# Patient Record
Sex: Male | Born: 1955 | Race: White | Hispanic: No | Marital: Married
Health system: Southern US, Community
[De-identification: ages and names within clinical notes are randomized; demographics above are authoritative.]

---

## 1998-07-26 ENCOUNTER — Ambulatory Visit (HOSPITAL_COMMUNITY): Admission: RE | Admit: 1998-07-26 | Discharge: 1998-07-26 | Payer: Self-pay | Admitting: General Surgery

## 2002-02-02 ENCOUNTER — Inpatient Hospital Stay (HOSPITAL_COMMUNITY): Admission: EM | Admit: 2002-02-02 | Discharge: 2002-02-07 | Payer: Self-pay | Admitting: *Deleted

## 2002-02-20 ENCOUNTER — Ambulatory Visit: Admission: RE | Admit: 2002-02-20 | Discharge: 2002-02-20 | Payer: Self-pay | Admitting: Internal Medicine

## 2002-03-15 ENCOUNTER — Ambulatory Visit (HOSPITAL_COMMUNITY): Admission: RE | Admit: 2002-03-15 | Discharge: 2002-03-15 | Payer: Self-pay | Admitting: Cardiology

## 2005-12-01 ENCOUNTER — Emergency Department (HOSPITAL_COMMUNITY): Admission: EM | Admit: 2005-12-01 | Discharge: 2005-12-01 | Payer: Self-pay | Admitting: Emergency Medicine

## 2007-03-16 ENCOUNTER — Encounter: Admission: RE | Admit: 2007-03-16 | Discharge: 2007-03-16 | Payer: Self-pay | Admitting: Internal Medicine

## 2007-11-08 ENCOUNTER — Encounter: Admission: RE | Admit: 2007-11-08 | Discharge: 2007-11-08 | Payer: Self-pay | Admitting: Gastroenterology

## 2008-08-22 ENCOUNTER — Emergency Department (HOSPITAL_COMMUNITY): Admission: EM | Admit: 2008-08-22 | Discharge: 2008-08-22 | Payer: Self-pay | Admitting: Emergency Medicine

## 2010-05-19 IMAGING — CT CT ABDOMEN W/O CM
2 of 4 series · 14 of 32 positions shown, 19 images · non-contrast
Comparison: 11/08/2007

CT ABDOMEN

CLINICAL DATA: Abdominal pain that radiates to right flank.
Dysuria.  Microscopic hematuria.

CT OF THE ABDOMEN AND PELVIS WITHOUT CONTRAST (CT UROGRAM)
TECHNIQUE: Multidetector CT imaging was performed through the
abdomen and pelvis to include the urinary tract.

[Series 2: renal stone · axial · 0.70mm/px · z∈[-442,-126]mm · 6 of 89 slices shown, 11 images]
[im 13/89  soft-tissue]
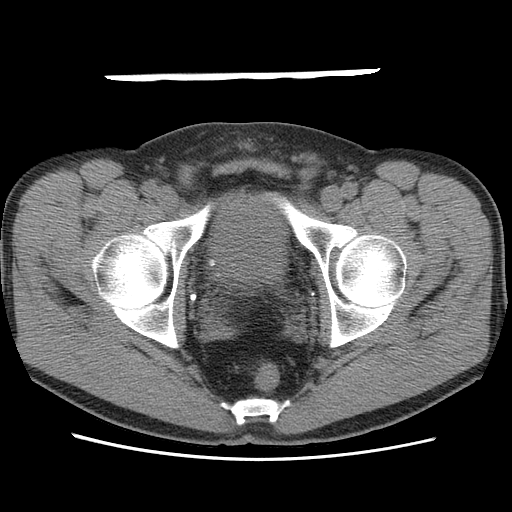
[im 13/89  bone]
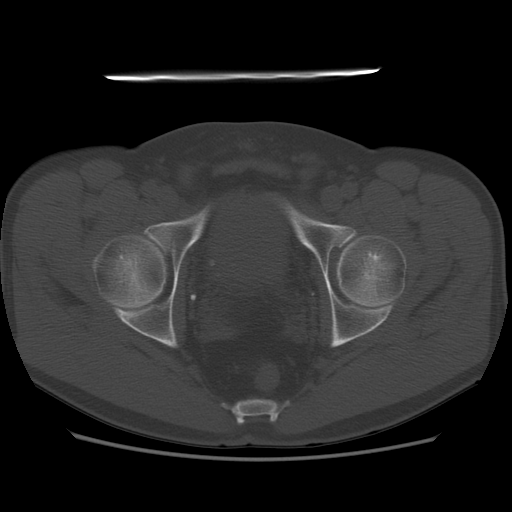
[im 26/89  soft-tissue]
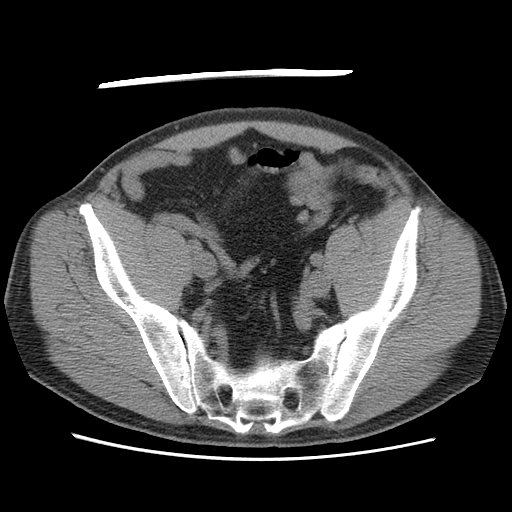
[im 38/89  soft-tissue]
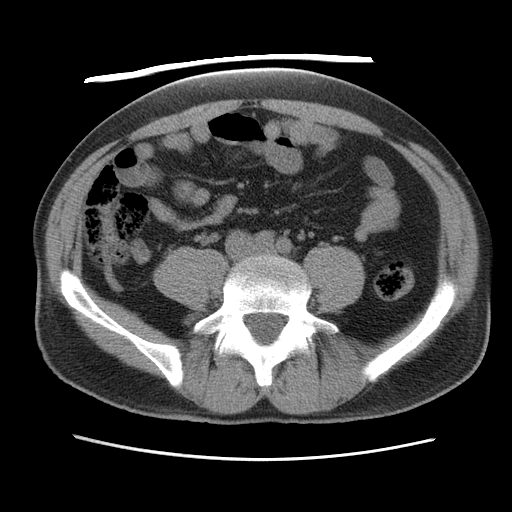
[im 38/89  lung]
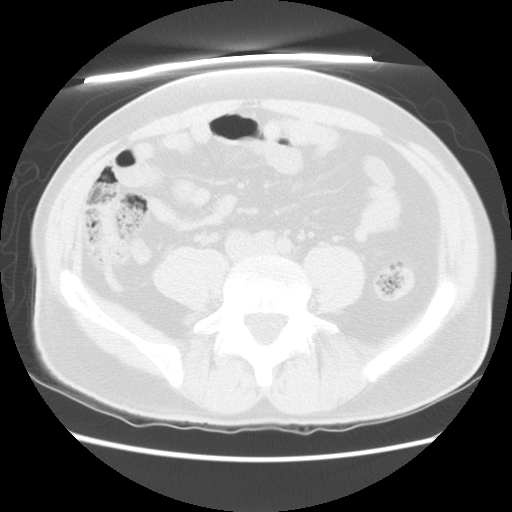
[im 51/89  soft-tissue]
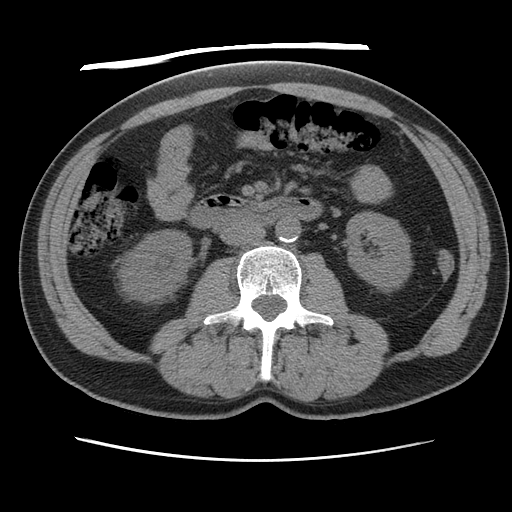
[im 51/89  lung]
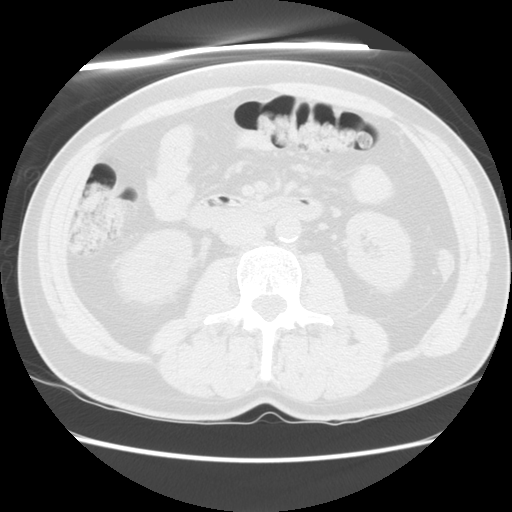
[im 63/89  soft-tissue]
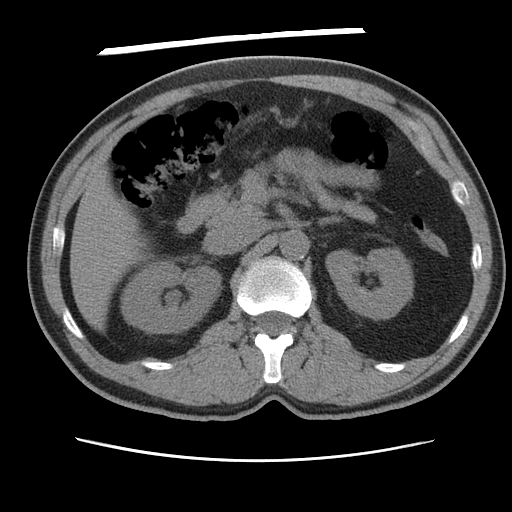
[im 63/89  lung]
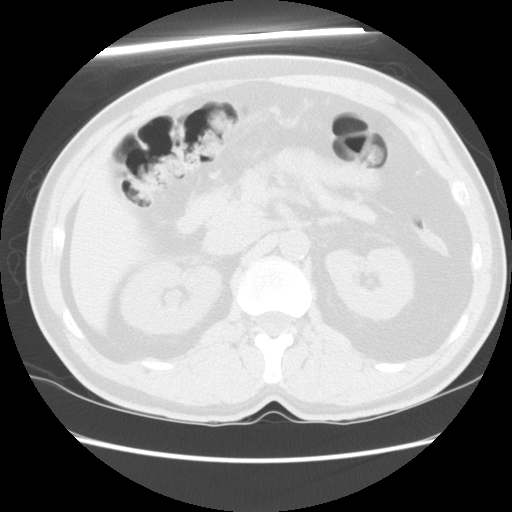
[im 76/89  soft-tissue]
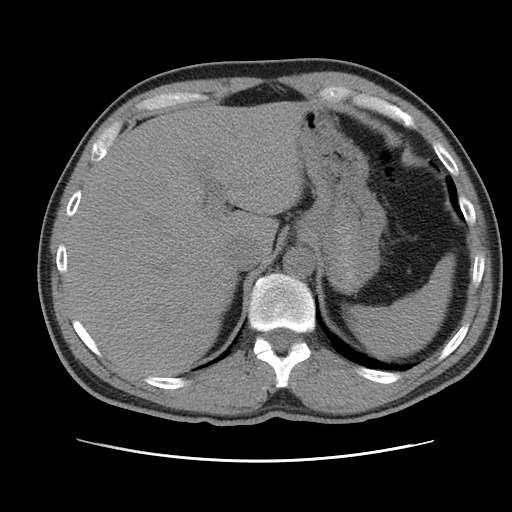
[im 76/89  lung]
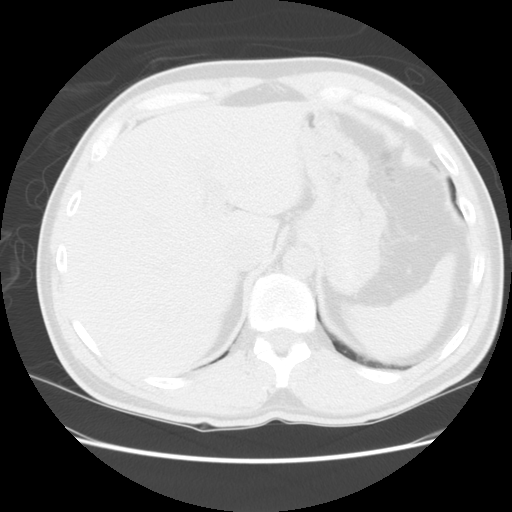

[Series 401: sagittals · sagittal · 0.89mm/px · 8 of 160 slices shown]
[im 15/160  soft-tissue]
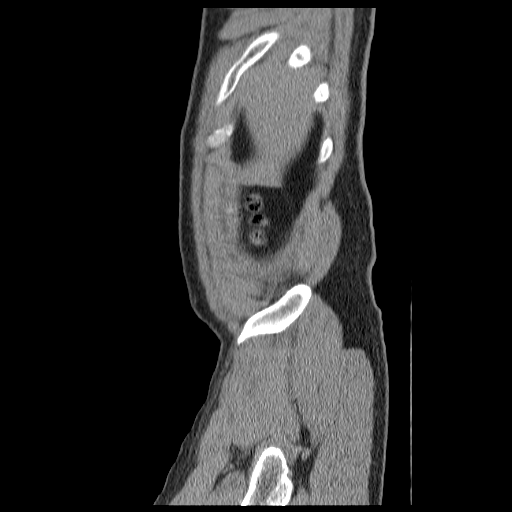
[im 29/160  soft-tissue]
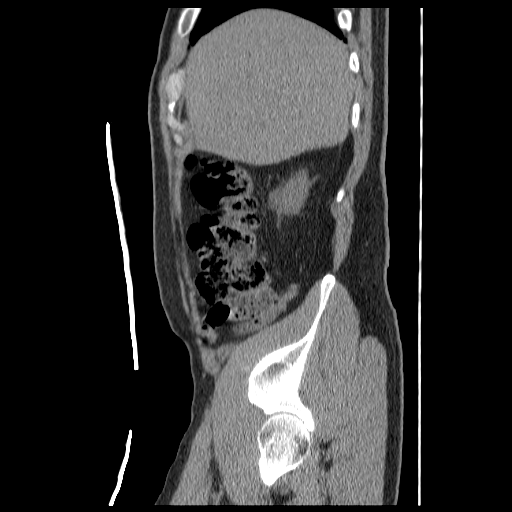
[im 58/160  soft-tissue]
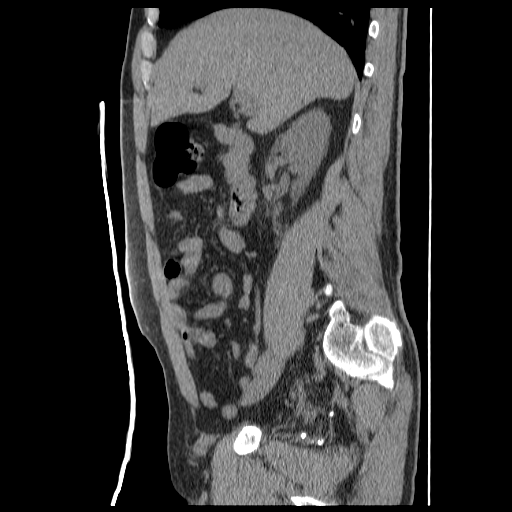
[im 73/160  soft-tissue]
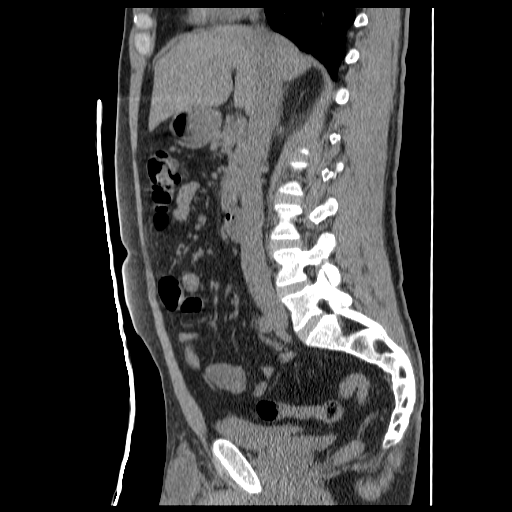
[im 87/160  soft-tissue]
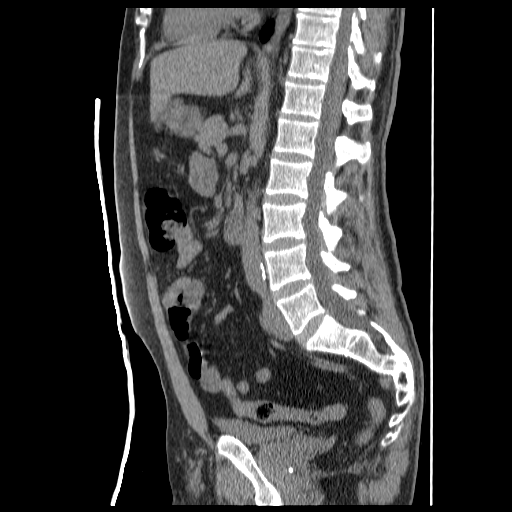
[im 102/160  soft-tissue]
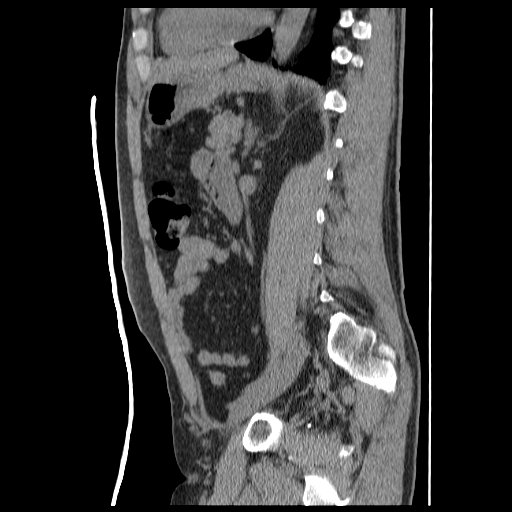
[im 131/160  soft-tissue]
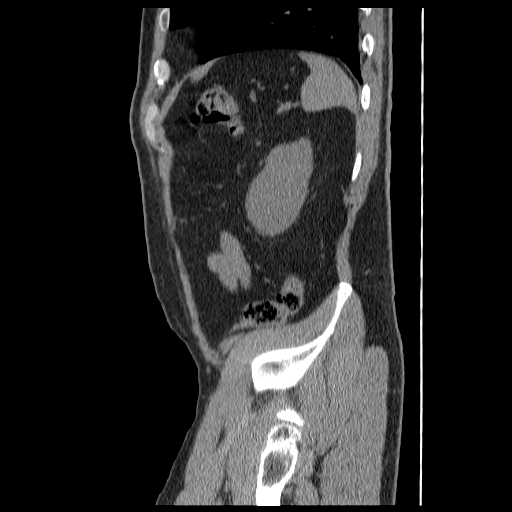
[im 145/160  soft-tissue]
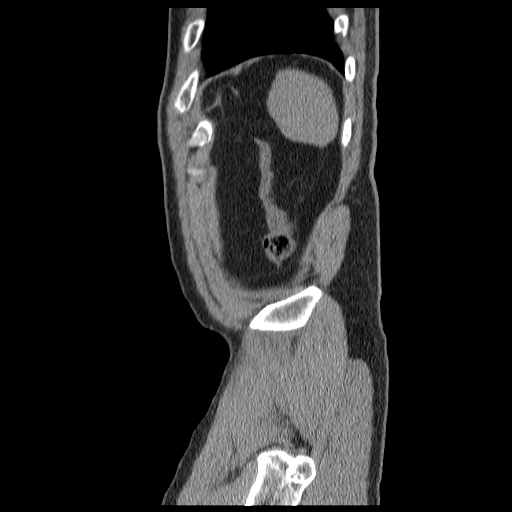

[14 of 32 positions shown; findings below may reference images not displayed]

FINDINGS: Findings compatible acute urinary tract obstruction on
the right.  Right hydroureteronephrosis, mild degree.  3 mm
calculus in the right lower pole collecting system.
IMPRESSION: Acute right hydroureteronephrosis..  CT pelvis follow-up.  Small
calculus right lower pole.

CT PELVIS
FINDINGS: 3 mm calculus at the right UVJ.  Bladder unremarkable.
Possible scarring at the left inguinal region.  Query history of
prior left herniorrhaphy.
IMPRESSION: 3 mm obstructing calculus at the right UVJ.

## 2011-03-20 NOTE — Discharge Summary (Signed)
[facility]  Patient:    BELAL, SCALLON Visit Number: [id number] MRN: [id number]          Service Type: MED Location: 3W [id number] Attending Physician:  Melvyn Novas Dictated by:   Melvyn Novas, M.D. Admit Date:  02/02/2002 Discharge Date: 02/07/2002   CC:         Kern Reap, M.D., [facility]  [facility]   Discharge Summary  REASON FOR CONSULTATION:  The patient was admitted on February 03, 2002 after an emergency room consultation for possible stroke.  The consult was called by the ER physician Dr. Josie Saunders.  He had presented with diplopia, difficulty with gait, nausea, vomiting, as well as left-sided numbness and right-sided weakness of the body.  A significant amount of Ativan and Phenergan was given prior to my consultation which someone blunted the patients affect and some of the neurologic findings as well.  The patient underwent a stroke workup and was admitted to neurology.  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  PROCEDURES AND CONSULTATIONS:  An MRI/MRA of the brain was obtained during the emergency room visit which showed a vertebral dissection of the artery on the right.  The patient also had an echocardiogram that showed normal sinus rhythm and QRS complexes.  The patient also had an echocardiogram which showed normal ejection fraction and no abnormal dilation, wall motion or valve problems.  A carotid Doppler study was obtained and showed normal flow.  Blood chemistry for coagulation, a basic metabolic Chem-7 panel, a urinalysis, liver function tests, albumin were all in normal range.  MEDICATIONS AT DISCHARGE:  The patient received IV heparin immediately after the diagnosis of vertebral artery dissection was made and was also given Coumadin on the first day of stay.  He had also p.r.n. medication, Tylenol 650 mg for headache and p.r.n. Reglan for  gastric reflux.  ALLERGIES:  No known drug allergies.  HOSPITAL COURSE:  The patient was admitted after the diagnosis of right vertebral artery dissection was made through an MRI/MRA study at [facility].  He recovered from all symptoms and the MRI did not verify any ischemic insult, stroke or embolic event.  He began Coumadin at 10 mg doses for the first 2 days of his hospital stay and then 7.5 mg on Sunday.  The INR on Sunday morning was 1.3, on Monday 1.9, and he was discharged on a q.d. Coumadin dose of 5 mg p.o.  The patient had normal vital signs and remained stable throughout the hospital stay but showed borderline hypertension in the 150 range with systolic blood pressures at least two times daily during his vital and neuro checks which were performed every 4 hours.  We established a primary care relationship for this patient with Dr. Barbee Shropshire of [facility] who will follow the patients INR levels and adjust the Coumadin accordingly.  I asked the patient to return to [facility] for a followup visit and at 6 months from now we can make a decision if Coumadin could be discontinued and perhaps an antiplatelet medication be started instead.  He is aware that another MRA or at least Doppler studies will be performed prior to allow [place] to make this decision.  The patient was not in favor of being placed on an antihypertensive medication and will be advised by Dr. Barbee Shropshire with whom he follows up 3 days after discharge.Dictated by:   Melvyn Novas, M.D. Attending Physician:  Melvyn Novas DD:  02/17/02 TD:  02/20/02 Job: [id number]

## 2011-03-20 NOTE — Op Note (Signed)
. [facility]  Patient:    VIRGLE, ARTH Visit Number: [id number] MRN: [id number]          Service Type: END Location: ENDO Attending Physician:  Pamella Pert Dictated by:   Delrae Rend, M.D. Proc. Date: 03/15/02 Admit Date:  03/15/2002   CC:         Georgianne Fick, M.D.   Operative Report  PROCEDURE:  Transesophageal echocardiogram.  INDICATION:  Mr. Skibicki is a 55 year old gentleman with no significant past medical history, who was admitted to the hospital with a vertebrobasilar insufficiency.  Given no cardiac risk factors and his new onset of vertebrobasilar insufficiency, a transesophageal echocardiogram is being performed to evaluate for evidence of atrial septal defect and/or patent foramen ovale.  TECHNIQUE:  Under mild sedation and local anesthetic spray, a [employer] omniplane probe was easily introduced into the esophagus and transesophageal echocardiogram was performed.  DATA:  Left atrium:  The left atrium is normal.  Left ventricle:  The left ventricle is normal.  There is normal segmental wall motion.  Right ventricle:  The right ventricle is normal.  Right atrium:  The right atrium is normal.  Mitral valve:  The mitral valve is normal.  There is minimal mitral valve regurgitation.  Tricuspid valve:  The tricuspid valve is normal.  Aortic valve:  The aortic valve is normal.  There is trivial aortic valvular regurgitation.  Pulmonary valve:  The pulmonary valve is normal.  There is trivial pulmonary valvular regurgitation.  Pericardium:  The pericardium is normal.  Left atrial appendage:  Left atrial appendage is well-visualized.  There is no evidence of left atrial appendage clot.  Interatrial septum:  The interatrial septum is intact.  There is no color Doppler evidence of PFO.  The double contrast study did not also reveal any evidence of patent foramen ovale or evidence of  right-to-left shunt.  Interventricular septum:  The interventricular septum is normal with no evidence of ventricular septal defects.  Aorta:  The ascending aorta, arch of the aorta, and the thoracic aorta all appear to be normal with no evidence of complex plaque.  FINAL INTERPRETATION: 1. Structurally normal heart. 2. No evidence of patent foramen ovale or ventricular septal defect, both by    color Doppler and by double contrast study. 3. No complex aortic plaque.Dictated by:   Delrae Rend, M.D. Attending Physician:  Pamella Pert DD:  03/15/02 TD:  03/16/02 Job: [id number]

## 2011-03-20 NOTE — Consult Note (Signed)
[facility]  Patient:    Thomas Hughes, Thomas Hughes Visit Number: [id number] MRN: [id number]          Service Type: MED Location: 3W [phone] Attending Physician:  Melvyn Novas Admit Date:  02/02/2002 Disc. Date: 02/07/02                            Consultation Report  HISTORY OF PRESENT ILLNESS:  [name] was admitted on February 03, 2002, through the ER after a consult by Dr. ______.  He presented with diplopia, difficulty with gait dystaxia, nausea, vomiting, as well as left-sided numbness and right-sided weakness of the body.  The patient had received a significant amount of Ativan and Phenergan, before I was able to evaluate him in the office, but was compliant and cooperative.  His family was at the bedside assisting with obtaining a complete history.  MRI/MRA study was performed that night at [facility] which showed right-sided vertebral dissection of the artery, and other tests which had been performed earlier to rule out increased risk of stroke turned out negative.  The patient had no findings by MRI of an ischemic insult in spinal cord brain stem or brain.  The echocardiogram showed a normal ejection fraction in the 60% range without dilation, akinetic parts, or valvular problems.  The obtained carotid Doppler showed bilaterally antegrade normal flow without evidence of stenosis. The prevertebral arteries also have been seen producing a normal antegrade flow by the Doppler study.  [name] diagnosis of dissection of the right vertebral artery made anticoagulation immediately necessary, and he was admitted for IV heparin by protocol without bolus.  The patient also received his first dose of Coumadin that day, and over the following weekend, received doses of 2x 10 mg and 7.5 mg on Sunday.  His INR on Sunday was 1.3, on Monday was 1.9.  He has recovered completely without focal neurologic deficits.  I find him  without significant impairment, and therefore discharged [name] to be followed by Dr. Barbee Shropshire at [facility] for his Coumadin management.  He will see me six months from now in a follow-up visit to have either a repeat MRA or a Doppler study, helping [place] to evaluate the degree of stenosis or obstruction of the right vertebral artery, and if this has deemed to clinical and significant degree, we will be able to change his Coumadin medication to an oral antiplatelet medication.  His vital signs remained stable throughout this time, but he showed borderline hypertension which is another problem I will address for his followup with Dr. Barbee Shropshire.  I discharged [name] on Coumadin 5 mg q.d., and encouraged him to take p.r.n. medication as needed for headaches, Tylenol 650 mg p.o. for gastric reflux, Reglan, and I also explained that a laxative of choice or an antacid of choice would not interfere with his Coumadin regimen.  [name] at this time was not in favor of being placed on a beta blocker or ACE inhibitor, and I believe his treatment of hypertension should be turned over to Dr. Barbee Shropshire. Attending Physician:  Melvyn Novas DD:  02/06/02 TD:  02/07/02 Job: [phone] [id number]

## 2011-03-20 NOTE — Consult Note (Signed)
NAME:  Thomas Hughes, Thomas Hughes NO.:  [id number]   MEDICAL RECORD NO.:  [id number]          PATIENT TYPE:  EMS   LOCATION:  MAJO                         FACILITY:  [facility]   PHYSICIAN:  Thomas P. Pearlean Brownie, MD    DATE OF BIRTH:  December 18, 1955   DATE OF CONSULTATION:  DATE OF DISCHARGE:                                   CONSULTATION   REFERRING PHYSICIAN:  Dr. Carleene Hughes.   REASON FOR REFERRAL:  Code stroke.   HISTORY OF PRESENT ILLNESS:  Mr. Thomas Hughes is a 55 year old Caucasian male who  developed sudden onset of multifocal symptoms of a flushed sensation on his  forehead, decreased concentration, right-sided neck pain as well as right  cheek and lip numbness which lasted for about 30-40 minutes.  He called his  wife, who drove him to the emergency room.  His symptoms resolved after  arrival.  A code stroke was called; I saw the patient immediately.  The  patient's symptoms had almost completely resolve and hence he was not a  candidate for any aggressive intervention.  CT scan of the head was  unremarkable.  Subsequently, an MRI scan of the brain with MRA was ordered  by me and interpreted by me, and it was also unremarkable.   The patient has a prior history of suspected right vertebral artery  dissection in April 2003 when he was admitted with symptoms of double  vision, right-sided neck pain, right-sided incoordination and weakness.  At  that time his MRI scan did not show any acute infarct; however, an MRA of  the neck showed diminished caliber of the right vertebral artery throughout,  raising the question of possible small proximal dissection.  The patient was  started on heparin and subsequently placed on Coumadin.  He was advised to  follow up in my practice, but chose to go to [facility], where he saw Dr. Aida Hughes.  An outpatient cerebral angiogram was done apparently at [facility] and the  diagnosis of right vertebral artery dissection was again not certain, but he  was asked to take  Coumadin, which he took for a year and discontinued.  His  only other vascular risk factor identified included borderline hypertension  for which he chose not to be on therapy.  The patient denies any history of  smoking, diabetes, heart disease, hyperlipidemia or family history of  strokes.   PAST MEDICAL HISTORY:  Unremarkable.   HOME MEDICATIONS:  None.   FAMILY HISTORY:  He works at [employer], does not smoke or drink and is  independent in activities of daily living.   REVIEW OF SYSTEMS:  Negative.   PHYSICAL EXAMINATION:  GENERAL:  Physical exam reveals a pleasant young  Caucasian male who is not in distress and afebrile.  Pulse rate is 70 per  minute and regular, blood pressure 191/110 on admission, respiratory rate is  16 per minute, pulse 96 per minute, regular.  Saturation is 96% on room air.  HEAD:  Nontraumatic.  NECK:  Supple without bruit.  ENT:  Exam unremarkable.  CARDIAC:  No murmur or gallop.  LUNGS:  Clear to auscultation.  ABDOMEN:  Soft and nontender.  NEUROLOGICAL:  The patient is awake, alert and oriented x3 with normal  speech and language function.  There is no aphasia, apraxia or dysarthria.  Pupils are about equally reactive.  There is no Horner's syndrome seen.  Visual acuity appears adequate.  Face is symmetric.  Bilateral movements are  normal.  Tongue is midline.  Motor system exam reveals symmetric upper and  lower extremity strength, tone, reflexes, coordination and sensation.  He  walks with a slow steady gait including tandem walking.   DATA REVIEW:  MRI scan of the brain done today reveals no acute or old  infarct.   MRA of the neck including fat suppression images do not show any definite  signs of right vertebral artery dissection.  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  MRA of the intracranial circulation shows symmetric flow in all  anterior as well as  posterior circulation vessels without any significant  high-grade stenosis or aneurysm seen.   Previous discharge summary from April 2003 from Dr. Vickey Hughes was reviewed.   IMPRESSION:  [name] with nonspecific symptoms of  right face paresthesias and dizziness and neck pain of unclear etiology.  He  has a previous history of possible right vertebral artery dissection for  which he has been on anticoagulation in the past.  At the present time I do  not believe his history as well as neuro-imaging findings support a  dissection.  I would highly recommend that he take enteric-coated aspirin  325 mg a day for stroke prevention, as well as start antihypertensives.  I  have advised him to follow up with his family physician, Dr. Andi Hughes, for the same.  He will follow up with his neurologist, Dr. Aida Hughes,  at [facility] for possible right vertebral artery dissection in the future as  necessary.   Thank you for the referral.           ______________________________  Thomas Hughes. Pearlean Brownie, MD     PPS/MEDQ  D:  12/01/2005  T:  12/02/2005  Job:  [id number]

## 2011-08-03 LAB — URINE MICROSCOPIC-ADD ON

## 2011-08-03 LAB — POCT I-STAT, CHEM 8
BUN: 22
Calcium, Ion: 1.31
Chloride: 104
Creatinine, Ser: 1
Glucose, Bld: 125 — ABNORMAL HIGH
HCT: 47
Hemoglobin: 16
Potassium: 4.7
Sodium: 142
TCO2: 30

## 2011-08-03 LAB — DIFFERENTIAL
Basophils Absolute: 0
Eosinophils Absolute: 0.1
Eosinophils Relative: 1
Lymphs Abs: 1
Monocytes Absolute: 0.6

## 2011-08-03 LAB — URINALYSIS, ROUTINE W REFLEX MICROSCOPIC
Bilirubin Urine: NEGATIVE
Leukocytes, UA: NEGATIVE
Nitrite: NEGATIVE
Specific Gravity, Urine: 1.032 — ABNORMAL HIGH
Urobilinogen, UA: 0.2
pH: 5

## 2011-08-03 LAB — CBC
HCT: 44.6
Hemoglobin: 15.3
RBC: 4.74
RDW: 13.7
WBC: 10.4

## 2013-08-30 ENCOUNTER — Other Ambulatory Visit: Payer: Self-pay | Admitting: Internal Medicine

## 2020-12-02 ENCOUNTER — Ambulatory Visit: Payer: Self-pay | Attending: Family

## 2020-12-02 DIAGNOSIS — Z23 Encounter for immunization: Secondary | ICD-10-CM

## 2021-03-21 NOTE — Progress Notes (Signed)
   Covid-19 Vaccination Clinic  Name:  Thomas Hughes    MRN: [id number] DOB: 09/02/1956  03/21/2021  [name] was observed post Covid-19 immunization for 15 minutes without incident. He was provided with Vaccine Information Sheet and instruction to access the V-Safe system.   [name] was instructed to call 911 with any severe reactions post vaccine: Marland Kitchen Difficulty breathing  . Swelling of face and throat  . A fast heartbeat  . A bad rash all over body  . Dizziness and weakness   Immunizations Administered    Name Date Dose VIS Date Route   Moderna Covid-19 Booster Vaccine 12/02/2020  1:45 PM 0.25 mL 08/21/2020 Intramuscular   Manufacturer: Moderna   Lot: [id number]   NDC: 66063-016-01

## 2022-11-19 ENCOUNTER — Emergency Department (HOSPITAL_COMMUNITY)
Admission: EM | Admit: 2022-11-19 | Discharge: 2022-11-19 | Disposition: A | Discharge status: Home / Self Care | Payer: BC Managed Care – PPO | Attending: Emergency Medicine | Admitting: Emergency Medicine

## 2022-11-19 ENCOUNTER — Other Ambulatory Visit: Payer: Self-pay

## 2022-11-19 ENCOUNTER — Emergency Department (HOSPITAL_COMMUNITY): Payer: BC Managed Care – PPO

## 2022-11-19 DIAGNOSIS — W000XXA Fall on same level due to ice and snow, initial encounter: Secondary | ICD-10-CM | POA: Insufficient documentation

## 2022-11-19 DIAGNOSIS — S0990XA Unspecified injury of head, initial encounter: Secondary | ICD-10-CM | POA: Insufficient documentation

## 2022-11-19 DIAGNOSIS — W19XXXA Unspecified fall, initial encounter: Secondary | ICD-10-CM

## 2022-11-19 DIAGNOSIS — M546 Pain in thoracic spine: Secondary | ICD-10-CM | POA: Insufficient documentation

## 2022-11-19 DIAGNOSIS — R0781 Pleurodynia: Secondary | ICD-10-CM | POA: Diagnosis not present

## 2022-11-19 DIAGNOSIS — M549 Dorsalgia, unspecified: Secondary | ICD-10-CM

## 2022-11-19 DIAGNOSIS — R7309 Other abnormal glucose: Secondary | ICD-10-CM | POA: Diagnosis not present

## 2022-11-19 LAB — CBG MONITORING, ED: Glucose-Capillary: 138 mg/dL — ABNORMAL HIGH (ref 70–99)

## 2022-11-19 NOTE — Discharge Instructions (Addendum)
You were seen in the emergency department after a fall.  As we discussed, your imaging did not show any broken or dislocated bones. We diagnose concussions based on the symptoms and I would not be surprised if you have one. I recommend following up with your primary doctor if your symptoms persist.   Please use acetaminophen (Tylenol) or ibuprofen (Advil, Motrin) for pain.  You may use 800 mg ibuprofen every 6 hours or 1000 mg of acetaminophen every 6 hours.  You may choose to alternate between the two, this would be most effective. Do not exceed 4000 mg of acetaminophen within 24 hours.  Do not exceed 3200 mg ibuprofen within 24 hours.  Continue to monitor how you're doing and return to the ER for new or worsening symptoms.

## 2022-11-19 NOTE — ED Triage Notes (Signed)
Pt states that on Monday [place] and slipped on a patch of ice, landing on his back and hitting the back of his head. Pt denies LOC, no blood thinners per pt. Pt was ambulatory since then. C/o back pain "between my shoulder blades" and L sided rib cage pain.

## 2022-11-19 NOTE — ED Provider Triage Note (Signed)
Emergency Medicine Provider Triage Evaluation Note  Thomas Hughes , a 67 y.o. male  was evaluated in triage.  Pt complains of fall x 3 days ago. Was hiking and slipped on some ice, falling onto his back. Struck his head, upper back, and left side of his ribs and continuing to have pain, especially with coughing.  Review of Systems  Positive: Upper back pain, left sided rib pain, mild headache, nausea Negative: Weakness, numbness, neck pain, low back pain  Physical Exam  BP (!) 167/106   Pulse 79   Temp 98.1 F (36.7 C) (Oral)   Resp 18   SpO2 100%  Gen:   Awake, no distress   Resp:  Normal effort  MSK:   Moves extremities without difficulty  Other:    Medical Decision Making  Medically screening exam initiated at 1:31 PM.  Appropriate orders placed.  Thomas Hughes was informed that the remainder of the evaluation will be completed by another provider, this initial triage assessment does not replace that evaluation, and the importance of remaining in the ED until their evaluation is complete.     Catlyn Shipton T, PA-C 11/19/22 1331

## 2022-11-19 NOTE — ED Provider Notes (Signed)
[facility] DEPT Provider Note   CSN: [id number] Arrival date & time: 11/19/22  1306     History  Chief Complaint  Patient presents with   Fall   Back Pain   Rib Injury    Thomas Hughes is a 67 y.o. male with no significant past medical history presents the emergency department after a fall.  Patient was hiking 3 days ago, and slipped on some ice.  He fell back hitting his head, his upper back, and the left side of his ribs.  He has been having pain since then, had some improvement with over-the-counter medications but since his symptoms persisted he wanted to be evaluated.  Denied any loss of consciousness at the time of the fall.  Has been feeling slightly nauseous, no vomiting.  He is concerned he could have concussion.  Not on chronic anticoagulation.   Fall Associated symptoms include headaches.  Back Pain Associated symptoms: headaches   Associated symptoms: no numbness and no weakness        Home Medications Prior to Admission medications   Not on File      Allergies    Patient has no known allergies.    Review of Systems   Review of Systems  Gastrointestinal:  Positive for nausea. Negative for vomiting.  Musculoskeletal:  Positive for back pain. Negative for neck pain.  Neurological:  Positive for headaches. Negative for weakness and numbness.  All other systems reviewed and are negative.   Physical Exam Updated Vital Signs BP (!) 156/86 (BP Location: Right Arm)   Pulse 63   Temp 97.7 F (36.5 C) (Oral)   Resp 16   SpO2 100%  Physical Exam Vitals and nursing note reviewed.  Constitutional:      Appearance: Normal appearance.  HENT:     Head: Normocephalic and atraumatic.  Eyes:     Conjunctiva/sclera: Conjunctivae normal.  Cardiovascular:     Rate and Rhythm: Normal rate and regular rhythm.  Pulmonary:     Effort: Pulmonary effort is normal. No respiratory distress.     Breath sounds: Normal breath sounds.   Abdominal:     General: There is no distension.     Palpations: Abdomen is soft.     Tenderness: There is no abdominal tenderness.  Musculoskeletal:     Comments: No midline spinal tenderness, step offs or crepitus. 5/5 strength in all extremities. Normal sensation.   Skin:    General: Skin is warm and dry.  Neurological:     General: No focal deficit present.     Mental Status: He is alert.     ED Results / Procedures / Treatments   Labs (all labs ordered are listed, but only abnormal results are displayed) Labs Reviewed  CBG MONITORING, ED - Abnormal; Notable for the following components:      Result Value   Glucose-Capillary 138 (*)    All other components within normal limits    EKG None  Radiology DG Thoracic Spine 2 View  Result Date: 11/19/2022 CLINICAL DATA:  Fall with back pain EXAM: THORACIC SPINE 2 VIEWS; LEFT RIBS AND CHEST - 3 VIEW COMPARISON:  None Available. FINDINGS: Mild age-indeterminate compression deformity of the upper thoracic spine. Mild levocurvature of the lumbar spine, approximate T4 level. Evaluation of the upper thoracic spine is limited due to overlying soft tissues. No listhesis. Mild multilevel degenerative disc disease. Cardiac and mediastinal contours are within normal limits. Clear lungs. No evidence of pleural effusion or pneumothorax.  IMPRESSION: 1. Mild age-indeterminate compression deformity of the upper thoracic spine. Correlate with point tenderness and consider CT for further evaluation. 2. No displaced rib fracture. 3. No evidence of pleural effusion or pneumothorax. Electronically Signed   By: Allegra Lai M.D.   On: 11/19/2022 15:37   DG Ribs Unilateral W/Chest Left  Result Date: 11/19/2022 CLINICAL DATA:  Fall with back pain EXAM: THORACIC SPINE 2 VIEWS; LEFT RIBS AND CHEST - 3 VIEW COMPARISON:  None Available. FINDINGS: Mild age-indeterminate compression deformity of the upper thoracic spine. Mild levocurvature of the lumbar spine,  approximate T4 level. Evaluation of the upper thoracic spine is limited due to overlying soft tissues. No listhesis. Mild multilevel degenerative disc disease. Cardiac and mediastinal contours are within normal limits. Clear lungs. No evidence of pleural effusion or pneumothorax. IMPRESSION: 1. Mild age-indeterminate compression deformity of the upper thoracic spine. Correlate with point tenderness and consider CT for further evaluation. 2. No displaced rib fracture. 3. No evidence of pleural effusion or pneumothorax. Electronically Signed   By: Allegra Lai M.D.   On: 11/19/2022 15:37   CT Head Wo Contrast  Result Date: 11/19/2022 CLINICAL DATA:  Fall, head trauma EXAM: CT HEAD WITHOUT CONTRAST TECHNIQUE: Contiguous axial images were obtained from the base of the skull through the vertex without intravenous contrast. RADIATION DOSE REDUCTION: This exam was performed according to the departmental dose-optimization program which includes automated exposure control, adjustment of the mA and/or kV according to patient size and/or use of iterative reconstruction technique. COMPARISON:  12/01/2005 FINDINGS: Brain: No evidence of acute infarction, hemorrhage, hydrocephalus, extra-axial collection or mass lesion/mass effect. Vascular: No hyperdense vessel or unexpected calcification. Skull: Normal. Negative for fracture or focal lesion. Sinuses/Orbits: No acute finding. Other: None. IMPRESSION: No acute intracranial pathology. Electronically Signed   By: Jearld Lesch M.D.   On: 11/19/2022 15:09    Procedures Procedures    Medications Ordered in ED Medications - No data to display  ED Course/ Medical Decision Making/ A&P                             Medical Decision Making Amount and/or Complexity of Data Reviewed Radiology: ordered.  This patient is a 67 y.o. male  who presents to the ED for concern of mechanical fall with head trauma x 3 days ago.   Past Medical History / Co-morbidities: No  significant PMH  Physical Exam: Physical exam performed. The pertinent findings include: Ambulates with normal gait.  No midline spinal tenderness, step-offs or crepitus.  Normal strength and sensation in all extremities.  Lab Tests/Imaging studies: I personally interpreted labs/imaging and the pertinent results include: CT of the head and x-rays of left ribs and thoracic spine with no acute findings. I agree with the radiologist interpretation.  Disposition: After consideration of the diagnostic results and the patients response to treatment, I feel that emergency department workup does not suggest an emergent condition requiring admission or immediate intervention beyond what has been performed at this time. The plan is: Discharge to home with recommendations for over-the-counter medications for musculoskeletal injuries.  Explained to the patient that it is possible he could have a concussion and how to treat the symptoms of this.  He is using prescription nausea medication.  I advised him to follow-up with his primary doctor if his symptoms continue.  The patient is safe for discharge and has been instructed to return immediately for worsening symptoms, change in symptoms  or any other concerns.  Final Clinical Impression(s) / ED Diagnoses Final diagnoses:  Fall, initial encounter  Upper back pain  Rib pain on left side  Injury of head, initial encounter    Rx / DC Orders ED Discharge Orders     None      Portions of this report may have been transcribed using voice recognition software. Every effort was made to ensure accuracy; however, inadvertent computerized transcription errors may be present.    Kateri Plummer, PA-C 11/19/22 1821    Jeanell Sparrow, DO 11/21/22 2131
